# Patient Record
Sex: Male | Born: 1985 | Race: White | Hispanic: No | Marital: Married | Smoking: Never smoker
Health system: Southern US, Community
[De-identification: ages and names within clinical notes are randomized; demographics above are authoritative.]

---

## 2015-03-20 ENCOUNTER — Ambulatory Visit (INDEPENDENT_AMBULATORY_CARE_PROVIDER_SITE_OTHER): Payer: 59 | Admitting: Physician Assistant

## 2015-03-20 ENCOUNTER — Encounter: Payer: Self-pay | Admitting: Physician Assistant

## 2015-03-20 ENCOUNTER — Telehealth: Payer: Self-pay

## 2015-03-20 VITALS — BP 124/75 | HR 70 | Temp 97.6°F | Resp 16 | Ht 68.25 in | Wt 207.0 lb

## 2015-03-20 DIAGNOSIS — Z7189 Other specified counseling: Secondary | ICD-10-CM

## 2015-03-20 DIAGNOSIS — L700 Acne vulgaris: Secondary | ICD-10-CM | POA: Diagnosis not present

## 2015-03-20 DIAGNOSIS — J3089 Other allergic rhinitis: Secondary | ICD-10-CM

## 2015-03-20 DIAGNOSIS — Z8659 Personal history of other mental and behavioral disorders: Secondary | ICD-10-CM | POA: Diagnosis not present

## 2015-03-20 DIAGNOSIS — H6123 Impacted cerumen, bilateral: Secondary | ICD-10-CM

## 2015-03-20 DIAGNOSIS — Z7689 Persons encountering health services in other specified circumstances: Secondary | ICD-10-CM

## 2015-03-20 NOTE — Progress Notes (Signed)
03/20/2015 at 5:12 PM  Brad Ruiz / DOB: [birth year] / MRN: [id number]  The patient  does not have a problem list on file.  SUBJECTIVE  Brad Ruiz is a 29 y.o. well appearing male presenting for the chief complaint of stuffy ears, back and neck pain, and would like some advise about his history of ADD. He also complains of acne.   He is a new Engineer, civil (consulting) working at [employer] and has recently moved to the area from [place].  He has been having severe allergies since moving here and requires Zyrtec daily for this with little relief of eye itching, sneezing, nasal congestion and ear stuffiness.    He has had acne on and off as a teen and reports good success with Benzoyl Peroxide, but has stopped this and can not remember why.  Complains of closed comedones mostly on the cheeks a forehead.    Has a history of ADD diagnosed at age 81 and would like further work up.  He reports he is forgetful or absent minded, and struggled through nursing school.  He would like to avoid amphetamines if possible.      He  has no past medical history on file.    Medications reviewed and updated by myself where necessary, and exist elsewhere in the encounter.   Brad Ruiz has No Known Allergies. He  reports that he has never smoked. He does not have any smokeless tobacco history on file. He  has no sexual activity history on file. The patient  has no past surgical history on file.  His family history is not on file.  Review of Systems  Constitutional: Negative for fever and chills.  Respiratory: Negative for shortness of breath.   Cardiovascular: Negative for chest pain.  Gastrointestinal: Negative for nausea and abdominal pain.  Genitourinary: Negative.   Skin: Negative for rash.  Neurological: Negative for dizziness and headaches.    OBJECTIVE  His  height is 5' 8.25" (1.734 m) and weight is 207 lb (93.895 kg). His oral temperature is 97.6 F (36.4 C). His blood pressure is 124/75 and his pulse is 70.  His respiration is 16.  The patient's body mass index is 31.23 kg/(m^2).  Physical Exam  Vitals reviewed. Constitutional: He is oriented to person, place, and time. He appears well-developed. No distress.  HENT:  Ears:  Nose: Mucosal edema (bluish hue) present.  Mouth/Throat: Uvula is midline, oropharynx is clear and moist and mucous membranes are normal.  Eyes: EOM are normal. Pupils are equal, round, and reactive to light. No scleral icterus.  Neck: Normal range of motion.  Cardiovascular: Normal rate and regular rhythm.   Respiratory: Effort normal and breath sounds normal.  GI: Soft. He exhibits no distension.  Musculoskeletal: Normal range of motion.  Neurological: He is alert and oriented to person, place, and time. No cranial nerve deficit.  Skin: Skin is warm and dry. No rash noted. He is not diaphoretic.  Psychiatric: He has a normal mood and affect.    No results found for this or any previous visit (from the past 24 hour(s)).  ASSESSMENT & PLAN  Brad Ruiz was seen today for establish care and allergic rhinitis .  Diagnoses and all orders for this visit:  Encounter to establish care  History of attention deficit disorder: Patient best managed by psychiatry given that he may require amphetamines for control.  -     Ambulatory referral to Psychiatry  Acne vulgaris: Patient with good success with Benzoyl Peroxide.  Advised he go back to that.   Cerumen impaction, bilateral -     Ear wax removal  Environmental and seasonal allergies: Advised patient to try Flonase two sprays bilaterally qam and to stay with Zyrtec for 7 days while bridging nasal steroid.      The patient was advised to call or come back to clinic if he does not see an improvement in symptoms, or worsens with the above plan.   Deliah Boston, MHS, PA-C [facility] 03/20/2015 5:12 PM

## 2015-03-20 NOTE — Patient Instructions (Signed)
Try Benzol peroxide 10% face wash.  (Acne Free)

## 2015-03-20 NOTE — Telephone Encounter (Signed)
Patient's wife request for [place] to call her mobile phone when Jacorion's referral is scheduled [phone].

## 2015-03-23 ENCOUNTER — Encounter: Payer: Self-pay | Admitting: Podiatry

## 2015-03-28 NOTE — Telephone Encounter (Signed)
Waiting on referral.  Would like to hear something soon.  He will be out of meds shortly.   [phone]

## 2015-04-05 NOTE — Progress Notes (Signed)
This encounter was created in error - please disregard.

## 2015-04-06 ENCOUNTER — Ambulatory Visit (INDEPENDENT_AMBULATORY_CARE_PROVIDER_SITE_OTHER): Payer: 59 | Admitting: Emergency Medicine

## 2015-04-06 VITALS — BP 128/80 | HR 69 | Temp 98.4°F | Resp 16 | Ht 69.0 in | Wt 206.0 lb

## 2015-04-06 DIAGNOSIS — F909 Attention-deficit hyperactivity disorder, unspecified type: Secondary | ICD-10-CM | POA: Diagnosis not present

## 2015-04-06 DIAGNOSIS — F988 Other specified behavioral and emotional disorders with onset usually occurring in childhood and adolescence: Secondary | ICD-10-CM

## 2015-04-06 MED ORDER — LISDEXAMFETAMINE DIMESYLATE 50 MG PO CAPS: 50.0000 mg | ORAL_CAPSULE | Freq: Every day | ORAL | Status: DC

## 2015-04-06 NOTE — Patient Instructions (Signed)

## 2015-04-06 NOTE — Progress Notes (Signed)
Subjective:  Patient ID: Brad Ruiz, male    DOB: 03/01/1986  Age: 29 y.o. MRN: [id number]  CC: Advice Only   HPI [name] presents  for evaluation and possible refill of his Vyvanse. While living in [place] high school and college she used initially Adderall and Vyvanse for control of his ADD. He stopped taking the medicine of May when he graduated college. Now he has a job as a Conservation officer, nature in the emergency room and is finding that he has trouble keeping on task. And concentrating on his job. His wife describes him constantly forgetting where he he keeps his keys and credit cards and work badges.   History Hunner has no past medical history on file.   He has no past surgical history on file.   His  family history includes Heart disease in his father.  He   reports that he has never smoked. He does not have any smokeless tobacco history on file. His alcohol and drug histories are not on file.  No outpatient prescriptions prior to visit.   No facility-administered medications prior to visit.    Social History   Social History  . Marital Status: Married    Spouse Name: N/A  . Number of Children: N/A  . Years of Education: N/A   Social History Main Topics  . Smoking status: Never Smoker   . Smokeless tobacco: None  . Alcohol Use: None  . Drug Use: None  . Sexual Activity: Not Asked   Other Topics Concern  . None   Social History Narrative     Review of Systems  Constitutional: Negative for fever, chills and appetite change.  HENT: Negative for congestion, ear pain, postnasal drip, sinus pressure and sore throat.   Eyes: Negative for pain and redness.  Respiratory: Negative for cough, shortness of breath and wheezing.   Cardiovascular: Negative for leg swelling.  Gastrointestinal: Negative for nausea, vomiting, abdominal pain, diarrhea, constipation and blood in stool.  Endocrine: Negative for polyuria.  Genitourinary: Negative for dysuria,  urgency, frequency and flank pain.  Musculoskeletal: Negative for gait problem.  Skin: Negative for rash.  Neurological: Negative for weakness and headaches.  Psychiatric/Behavioral: Negative for confusion and decreased concentration. The patient is not nervous/anxious.     Objective:  BP 128/80 mmHg  Pulse 69  Temp(Src) 98.4 F (36.9 C) (Oral)  Resp 16  Ht  (1.753 m)  Wt 206 lb (93.441 kg)  BMI 30.41 kg/m2  SpO2 98%  Physical Exam  Constitutional: He is oriented to person, place, and time. He appears well-developed and well-nourished. No distress.  HENT:  Head: Normocephalic and atraumatic.  Right Ear: External ear normal.  Left Ear: External ear normal.  Nose: Nose normal.  Eyes: Conjunctivae and EOM are normal. Pupils are equal, round, and reactive to light. No scleral icterus.  Neck: Normal range of motion. Neck supple. No tracheal deviation present.  Cardiovascular: Normal rate, regular rhythm and normal heart sounds.   Pulmonary/Chest: Effort normal. No respiratory distress. He has no wheezes. He has no rales.  Abdominal: He exhibits no mass. There is no tenderness. There is no rebound and no guarding.  Musculoskeletal: He exhibits no edema.  Lymphadenopathy:    He has no cervical adenopathy.  Neurological: He is alert and oriented to person, place, and time. Coordination normal.  Skin: Skin is warm and dry. No rash noted.  Psychiatric: He has a normal mood and affect. His behavior is normal.  Assessment & Plan:   Raylon was seen today for advice only.  Diagnoses and all orders for this visit:  ADD (attention deficit disorder)  Other orders -     lisdexamfetamine (VYVANSE) 50 MG capsule; Take 1 capsule (50 mg total) by mouth daily. -     lisdexamfetamine (VYVANSE) 50 MG capsule; Take 1 capsule (50 mg total) by mouth daily. May fill in 30 days -     lisdexamfetamine (VYVANSE) 50 MG capsule; Take 1 capsule (50 mg total) by mouth daily. May fill in 60  days  I am having Mr. Caesar start on lisdexamfetamine, lisdexamfetamine, and lisdexamfetamine.  Meds ordered this encounter  Medications  . lisdexamfetamine (VYVANSE) 50 MG capsule    Sig: Take 1 capsule (50 mg total) by mouth daily.    Dispense:  30 capsule    Refill:  0  . lisdexamfetamine (VYVANSE) 50 MG capsule    Sig: Take 1 capsule (50 mg total) by mouth daily. May fill in 30 days    Dispense:  30 capsule    Refill:  0  . lisdexamfetamine (VYVANSE) 50 MG capsule    Sig: Take 1 capsule (50 mg total) by mouth daily. May fill in 60 days    Dispense:  30 capsule    Refill:  0    Appropriate red flag conditions were discussed with the patient as well as actions that should be taken.  Patient expressed his understanding.  Follow-up: Return in about 3 months (around 07/06/2015).  Carmelina Dane, MD

## 2015-04-27 ENCOUNTER — Ambulatory Visit (INDEPENDENT_AMBULATORY_CARE_PROVIDER_SITE_OTHER): Payer: 59

## 2015-04-27 ENCOUNTER — Ambulatory Visit (INDEPENDENT_AMBULATORY_CARE_PROVIDER_SITE_OTHER): Payer: 59 | Admitting: Urgent Care

## 2015-04-27 VITALS — BP 106/74 | HR 64 | Temp 98.2°F | Resp 16 | Ht 69.0 in | Wt 202.0 lb

## 2015-04-27 DIAGNOSIS — M62830 Muscle spasm of back: Secondary | ICD-10-CM

## 2015-04-27 DIAGNOSIS — M545 Low back pain: Secondary | ICD-10-CM | POA: Diagnosis not present

## 2015-04-27 DIAGNOSIS — S39012A Strain of muscle, fascia and tendon of lower back, initial encounter: Secondary | ICD-10-CM | POA: Diagnosis not present

## 2015-04-27 DIAGNOSIS — F909 Attention-deficit hyperactivity disorder, unspecified type: Secondary | ICD-10-CM

## 2015-04-27 DIAGNOSIS — F988 Other specified behavioral and emotional disorders with onset usually occurring in childhood and adolescence: Secondary | ICD-10-CM

## 2015-04-27 MED ORDER — LISDEXAMFETAMINE DIMESYLATE 50 MG PO CAPS: 50.0000 mg | ORAL_CAPSULE | Freq: Every day | ORAL | Status: DC

## 2015-04-27 MED ORDER — CYCLOBENZAPRINE HCL 10 MG PO TABS: 5.0000 mg | ORAL_TABLET | Freq: Three times a day (TID) | ORAL | Status: DC | PRN

## 2015-04-27 MED ORDER — MELOXICAM 15 MG PO TABS: 7.5000 mg | ORAL_TABLET | Freq: Every day | ORAL | Status: DC
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
## 2015-04-27 NOTE — Patient Instructions (Signed)

## 2015-05-03 ENCOUNTER — Telehealth: Payer: Self-pay

## 2015-05-03 DIAGNOSIS — M545 Low back pain: Secondary | ICD-10-CM

## 2015-05-03 NOTE — Telephone Encounter (Signed)
1. Lumbar strain, initial encounter 2. Low back pain without sciatica, unspecified back pain laterality 3. Back spasm - Advised conservative management. Start meloxicam and Flexeril. Recommended modification of activities including taking a break from weight training including dead lifts and squats. Will recommend physical therapy or referral to ortho if no improvement in 2 weeks. Patient agreed.  Did you want to change medication or place referral?

## 2015-05-03 NOTE — Telephone Encounter (Signed)
Pt wife Baxter Hire called in very upset that she called in earlier this morning and left a message for her husband to get a referral and it hasn't happened yet.  I explained to her that messages were already in and as soon as the referral was done by the doctor we would get it taken care of. She feels it's unacceptable to have to wait this long. Pt wife states that he is in terrible pain & agony.

## 2015-05-03 NOTE — Telephone Encounter (Signed)
I placed the referral. It may be worthwhile for the patient to come for a urinalysis as well since his pain may be due to kidney stones. Please offer this to patient. Thank you!

## 2015-05-03 NOTE — Telephone Encounter (Signed)
Left message for pt to call back  °

## 2015-05-03 NOTE — Telephone Encounter (Signed)
Patient's wife Baxter Hire called to request a referral for her husband to see an orthopaedic doctor for his back spasms.  He was seen on 04/27/15 and was prescribed medications for the pain, but she said they are not working.  According to his wife, he is in agony and he needs a referral now.  CB#: [phone]

## 2015-05-04 NOTE — Telephone Encounter (Signed)
I called [facility] to try to set up appt  Tomorrow 9:15 am Dr. Clista Bernhardt pt to let him know and he is unable to go to this appt. He states he has an appt on the 5th and they will just keep this appt.

## 2015-05-09 NOTE — Progress Notes (Signed)
  Medical screening examination/treatment/procedure(s) were performed by non-physician practitioner and as supervising physician I was immediately available for consultation/collaboration.     

## 2015-06-15 ENCOUNTER — Ambulatory Visit (INDEPENDENT_AMBULATORY_CARE_PROVIDER_SITE_OTHER): Payer: 59 | Admitting: Emergency Medicine

## 2015-06-15 VITALS — BP 112/60 | HR 70 | Temp 97.8°F | Resp 16 | Ht 69.0 in | Wt 200.4 lb

## 2015-06-15 DIAGNOSIS — F909 Attention-deficit hyperactivity disorder, unspecified type: Secondary | ICD-10-CM

## 2015-06-15 DIAGNOSIS — F988 Other specified behavioral and emotional disorders with onset usually occurring in childhood and adolescence: Secondary | ICD-10-CM

## 2015-06-15 DIAGNOSIS — M5431 Sciatica, right side: Secondary | ICD-10-CM

## 2015-06-15 LAB — BASIC METABOLIC PANEL
BUN: 28 mg/dL — AB (ref 7–25)
CALCIUM: 9.7 mg/dL (ref 8.6–10.3)
CO2: 27 mmol/L (ref 20–31)
Chloride: 102 mmol/L (ref 98–110)
Creat: 0.97 mg/dL (ref 0.60–1.35)
GLUCOSE: 78 mg/dL (ref 65–99)
POTASSIUM: 4 mmol/L (ref 3.5–5.3)
Sodium: 139 mmol/L (ref 135–146)

## 2015-06-15 MED ORDER — LISDEXAMFETAMINE DIMESYLATE 50 MG PO CAPS: 50.0000 mg | ORAL_CAPSULE | Freq: Every day | ORAL | Status: AC

## 2015-06-15 MED ORDER — LISDEXAMFETAMINE DIMESYLATE 50 MG PO CAPS: 50.0000 mg | ORAL_CAPSULE | Freq: Every day | ORAL | Status: DC

## 2015-06-15 MED ORDER — LISDEXAMFETAMINE DIMESYLATE 50 MG PO CAPS
50.0000 mg | ORAL_CAPSULE | Freq: Every day | ORAL | Status: DC
Start: 2015-06-15 — End: 2015-09-28

## 2015-06-15 MED ORDER — PREDNISONE 10 MG (48) PO TBPK: ORAL_TABLET | ORAL | Status: DC

## 2015-06-15 MED ORDER — TRAMADOL HCL 50 MG PO TABS: 50.0000 mg | ORAL_TABLET | Freq: Four times a day (QID) | ORAL | Status: DC | PRN

## 2015-06-15 NOTE — Progress Notes (Signed)
Subjective:  Patient ID: Brad Ruiz, male    DOB: [birth year]  Age: 29 y.o. MRN: [id number]  CC: Back Pain and Medication Refill   HPI Brad Ruiz presents  for follow-up for his back. Schedule for MRI and has persistent right sciatic neuritis. He says he is worse when he sits down and has to sit for periods time. He has no numbness tingling or weakness in his leg. His medication that he was treated with did not improve his discomfort. He is scheduled for MRI. He also needs refill on his Vyvanse. He's been taking the medication having him no adverse effect of the medication is eating and sleeping maintaining his weight.  History Brad Ruiz has no past medical history on file.   He has no past surgical history on file.   His  family history includes Heart disease in his father.  He   reports that he has never smoked. He does not have any smokeless tobacco history on file. His alcohol and drug histories are not on file.  Outpatient Prescriptions Prior to Visit  Medication Sig Dispense Refill  . cyclobenzaprine (FLEXERIL) 10 MG tablet Take 0.5-1 tablets (5-10 mg total) by mouth 3 (three) times daily as needed for muscle spasms. 60 tablet 6  . lisdexamfetamine (VYVANSE) 50 MG capsule Take 1 capsule (50 mg total) by mouth daily. 30 capsule 0  . lisdexamfetamine (VYVANSE) 50 MG capsule Take 1 capsule (50 mg total) by mouth daily. May fill in 30 days 30 capsule 0  . lisdexamfetamine (VYVANSE) 50 MG capsule Take 1 capsule (50 mg total) by mouth daily. May fill 06/06/2015. 30 capsule 0  . meloxicam (MOBIC) 15 MG tablet Take 0.5-1 tablets (7.5-15 mg total) by mouth daily. 30 tablet 1   No facility-administered medications prior to visit.    Social History   Social History  . Marital Status: Married    Spouse Name: N/A  . Number of Children: N/A  . Years of Education: N/A   Social History Main Topics  . Smoking status: Never Smoker   . Smokeless tobacco: None  . Alcohol Use:  None  . Drug Use: None  . Sexual Activity: Not Asked   Other Topics Concern  . None   Social History Narrative     Review of Systems  Constitutional: Negative for fever, chills and appetite change.  HENT: Negative for congestion, ear pain, postnasal drip, sinus pressure and sore throat.   Eyes: Negative for pain and redness.  Respiratory: Negative for cough, shortness of breath and wheezing.   Cardiovascular: Negative for leg swelling.  Gastrointestinal: Negative for nausea, vomiting, abdominal pain, diarrhea, constipation and blood in stool.  Endocrine: Negative for polyuria.  Genitourinary: Negative for dysuria, urgency, frequency and flank pain.  Musculoskeletal: Positive for back pain. Negative for gait problem.  Skin: Negative for rash.  Neurological: Negative for weakness and headaches.  Psychiatric/Behavioral: Negative for confusion and decreased concentration. The patient is not nervous/anxious.     Objective:  BP 112/60 mmHg  Pulse 70  Temp(Src) 97.8 F (36.6 C) (Oral)  Resp 16  Ht  (1.753 m)  Wt 200 lb 6.4 oz (90.901 kg)  BMI 29.58 kg/m2  SpO2 99%  Physical Exam  Constitutional: He is oriented to person, place, and time. He appears well-developed and well-nourished.  HENT:  Head: Normocephalic and atraumatic.  Eyes: Conjunctivae are normal. Pupils are equal, round, and reactive to light.  Pulmonary/Chest: Effort normal.  Musculoskeletal: He exhibits no edema.  Neurological:  He is alert and oriented to person, place, and time.  Skin: Skin is dry.  Psychiatric: He has a normal mood and affect. His behavior is normal. Thought content normal.      Assessment & Plan:   Brad Ruiz was seen today for back pain and medication refill.  Diagnoses and all orders for this visit:  ADD (attention deficit disorder)  Sciatic neuritis, right -     Basic metabolic panel  Other orders -     traMADol (ULTRAM) 50 MG tablet; Take 1 tablet (50 mg total) by mouth  every 6 (six) hours as needed. -     predniSONE (STERAPRED UNI-PAK 48 TAB) 10 MG (48) TBPK tablet; Take as directed on package -     lisdexamfetamine (VYVANSE) 50 MG capsule; Take 1 capsule (50 mg total) by mouth daily. -     lisdexamfetamine (VYVANSE) 50 MG capsule; Take 1 capsule (50 mg total) by mouth daily. May fill in 30 days -     lisdexamfetamine (VYVANSE) 50 MG capsule; Take 1 capsule (50 mg total) by mouth daily. May fill in 60 days   I have discontinued Brad Ruiz's meloxicam. I have also changed his lisdexamfetamine. Additionally, I am having him start on traMADol and predniSONE. Lastly, I am having him maintain his cyclobenzaprine, lisdexamfetamine, and lisdexamfetamine.  Meds ordered this encounter  Medications  . traMADol (ULTRAM) 50 MG tablet    Sig: Take 1 tablet (50 mg total) by mouth every 6 (six) hours as needed.    Dispense:  50 tablet    Refill:  0  . predniSONE (STERAPRED UNI-PAK 48 TAB) 10 MG (48) TBPK tablet    Sig: Take as directed on package    Dispense:  48 tablet    Refill:  0  . lisdexamfetamine (VYVANSE) 50 MG capsule    Sig: Take 1 capsule (50 mg total) by mouth daily.    Dispense:  30 capsule    Refill:  0  . lisdexamfetamine (VYVANSE) 50 MG capsule    Sig: Take 1 capsule (50 mg total) by mouth daily. May fill in 30 days    Dispense:  30 capsule    Refill:  0  . lisdexamfetamine (VYVANSE) 50 MG capsule    Sig: Take 1 capsule (50 mg total) by mouth daily. May fill in 60 days    Dispense:  30 capsule    Refill:  0    Appropriate red flag conditions were discussed with the patient as well as actions that should be taken.  Patient expressed his understanding.  Follow-up: Return if symptoms worsen or fail to improve.  Carmelina DaneAnderson, Deronte Solis S, MD

## 2015-06-15 NOTE — Patient Instructions (Signed)
Radicular Pain °Radicular pain in either the arm or leg is usually from a bulging or herniated disk in the spine. A piece of the herniated disk may press against the nerves as the nerves exit the spine. This causes pain which is felt at the tips of the nerves down the arm or leg. Other causes of radicular pain may include: °· Fractures. °· Heart disease. °· Cancer. °· An abnormal and usually degenerative state of the nervous system or nerves (neuropathy). °Diagnosis may require CT or MRI scanning to determine the primary cause.  °Nerves that start at the neck (nerve roots) may cause radicular pain in the outer shoulder and arm. It can spread down to the thumb and fingers. The symptoms vary depending on which nerve root has been affected. In most cases radicular pain improves with conservative treatment. Neck problems may require physical therapy, a neck collar, or cervical traction. Treatment may take many weeks, and surgery may be considered if the symptoms do not improve.  °Conservative treatment is also recommended for sciatica. Sciatica causes pain to radiate from the lower back or buttock area down the leg into the foot. Often there is a history of back problems. Most patients with sciatica are better after 2 to 4 weeks of rest and other supportive care. Short term bed rest can reduce the disk pressure considerably. Sitting, however, is not a good position since this increases the pressure on the disk. You should avoid bending, lifting, and all other activities which make the problem worse. Traction can be used in severe cases. Surgery is usually reserved for patients who do not improve within the first months of treatment. °Only take over-the-counter or prescription medicines for pain, discomfort, or fever as directed by your caregiver. Narcotics and muscle relaxants may help by relieving more severe pain and spasm and by providing mild sedation. Cold or massage can give significant relief. Spinal manipulation  is not recommended. It can increase the degree of disc protrusion. Epidural steroid injections are often effective treatment for radicular pain. These injections deliver medicine to the spinal nerve in the space between the protective covering of the spinal cord and back bones (vertebrae). Your caregiver can give you more information about steroid injections. These injections are most effective when given within two weeks of the onset of pain.  °You should see your caregiver for follow up care as recommended. A program for neck and back injury rehabilitation with stretching and strengthening exercises is an important part of management.  °SEEK IMMEDIATE MEDICAL CARE IF: °· You develop increased pain, weakness, or numbness in your arm or leg. °· You develop difficulty with bladder or bowel control. °· You develop abdominal pain. °  °This information is not intended to replace advice given to you by your health care provider. Make sure you discuss any questions you have with your health care provider. °  °Document Released: 08/29/2004 Document Revised: 08/12/2014 Document Reviewed: 02/15/2015 °Elsevier Interactive Patient Education ©2016 [employer] ° °

## 2015-09-28 ENCOUNTER — Ambulatory Visit (INDEPENDENT_AMBULATORY_CARE_PROVIDER_SITE_OTHER): Payer: 59 | Admitting: Family Medicine

## 2015-09-28 VITALS — BP 118/76 | HR 67 | Temp 97.8°F | Resp 16 | Ht 68.0 in | Wt 200.0 lb

## 2015-09-28 DIAGNOSIS — F988 Other specified behavioral and emotional disorders with onset usually occurring in childhood and adolescence: Secondary | ICD-10-CM

## 2015-09-28 DIAGNOSIS — F909 Attention-deficit hyperactivity disorder, unspecified type: Secondary | ICD-10-CM

## 2015-09-28 DIAGNOSIS — M545 Low back pain, unspecified: Secondary | ICD-10-CM

## 2015-09-28 MED ORDER — MELOXICAM 15 MG PO TABS: 15.0000 mg | ORAL_TABLET | Freq: Every day | ORAL | Status: AC

## 2015-09-28 MED ORDER — LISDEXAMFETAMINE DIMESYLATE 50 MG PO CAPS: 50.0000 mg | ORAL_CAPSULE | Freq: Every day | ORAL | Status: DC

## 2015-09-28 NOTE — Patient Instructions (Addendum)
I have put in an order for a referral for ADD testing, you should receive a call from our office by Tuesday of next week to set up an appointment. If you have not heard from [place], please call the office and ask to speak to the referrals department.

## 2015-09-28 NOTE — Progress Notes (Signed)
Subjective:    Patient ID: Brad Ruiz, male    DOB: 1986-02-20, 30 y.o.   MRN: [id number]  HPI This is a pleasant 30 yo male who presents today for refill of Vyvanse for ADD. He is accompanied by his wife who is also being seen. 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 He works 12 hour night shifts. He enjoys the ED. He reports long standing Add and requests refill of his Vyvanse. He reports that it works well for him and he denies chest pain, sob, palpitations. Sleeps and eats well.   He was seen 8/16 and was referred to psychiatry for evaluation of ADD. The patient was nearly out of his medication and came in and was seen 9/16 by Dr. Dareen Piano who provided prescriptions for Vyvanse. He returned 06/15/15 and again saw Dr. Dareen Piano who refilled his Vyvanse. He has been on medication since middle school. His previous provider has retired and the patient does not have any records.   He has had intermittent low back pain since 6/16, started with an acute injury and was treated with Meloxicam with some improvement. Takes ibuprofen 800 mg several times a day, several times a week, would prefer to take Meloxicam. No weakness, no numbness/tingling. Has started yoga with some improvement, but feels like his improvement has stopped.   History reviewed. No pertinent past medical history. History reviewed. No pertinent past surgical history. Family History  Problem Relation Age of Onset  . Heart disease Father    Social History  Substance Use Topics  . Smoking status: Never Smoker   . Smokeless tobacco: Never Used  . Alcohol Use: No    Review of Systems No chest pain, no SOB, no palpitations    Objective:   Physical Exam Physical Exam  Constitutional: Oriented to person, place, and time. He appears well-developed and well-nourished.  HENT:  Head: Normocephalic and atraumatic.  Eyes: Conjunctivae are normal.  Neck: Normal range of motion. Neck supple.  Cardiovascular: Normal rate, regular  rhythm and normal heart sounds.   Pulmonary/Chest: Effort normal and breath sounds normal.  Musculoskeletal: Normal range of motion. Gait normal.  Neurological: Alert and oriented to person, place, and time.  Skin: Skin is warm and dry.  Psychiatric: Normal mood and affect. Behavior is normal. Judgment and thought content normal.  Vitals reviewed. BP 118/76 mmHg  Pulse 67  Temp(Src) 97.8 F (36.6 C) (Oral)  Resp 16  Ht  (1.727 m)  Wt 200 lb (90.719 kg)  BMI 30.42 kg/m2  SpO2 98% Wt Readings from Last 3 Encounters:  09/28/15 200 lb (90.719 kg)  06/15/15 200 lb 6.4 oz (90.901 kg)  04/27/15 202 lb (91.627 kg)      Assessment & Plan:  1. ADD (attention deficit disorder) - Discussed need for testing for prescribing. Will provide 2 months of Vyvanse while awaiting testing - Ambulatory referral to Psychology - lisdexamfetamine (VYVANSE) 50 MG capsule; Take 1 capsule (50 mg total) by mouth daily.  Dispense: 30 capsule; Refill: 0 - lisdexamfetamine (VYVANSE) 50 MG capsule; Take 1 capsule (50 mg total) by mouth daily. May fill in 30 days  Dispense: 30 capsule; Refill: 0  3. Midline back pain without sciatica - Meloxicam 15 mg; 1 po q day. Dispense 30, refill 0- discussed importance of not taking additional NSAIDs and using medication sparingly - Amb ref to PT  Olean Ree, FNP-BC  [facility], [facility]  09/28/2015 11:55 AM

## 2015-11-01 ENCOUNTER — Telehealth: Payer: Self-pay

## 2015-11-01 NOTE — Telephone Encounter (Signed)
Pt is needing to get a refill on his vyvanse best number [phone]

## 2015-11-02 NOTE — Telephone Encounter (Signed)
Spoke with patient's wife. [facility]. I will refill his Vyvanse for 2 months to get him through to his appointment. He will let me know who he wants to see for PT.

## 2015-11-06 ENCOUNTER — Other Ambulatory Visit: Payer: Self-pay | Admitting: Family Medicine

## 2015-11-06 DIAGNOSIS — F988 Other specified behavioral and emotional disorders with onset usually occurring in childhood and adolescence: Secondary | ICD-10-CM

## 2015-11-06 MED ORDER — LISDEXAMFETAMINE DIMESYLATE 50 MG PO CAPS: 50.0000 mg | ORAL_CAPSULE | Freq: Every day | ORAL | Status: AC

## 2015-11-06 NOTE — Telephone Encounter (Signed)
Please call patient and let him know that his prescription is ready for pick up at 102.

## 2015-12-28 DIAGNOSIS — M5431 Sciatica, right side: Secondary | ICD-10-CM | POA: Diagnosis not present

## 2016-01-02 DIAGNOSIS — M5441 Lumbago with sciatica, right side: Secondary | ICD-10-CM | POA: Diagnosis not present

## 2016-01-02 DIAGNOSIS — M545 Low back pain: Secondary | ICD-10-CM | POA: Diagnosis not present

## 2016-01-03 ENCOUNTER — Other Ambulatory Visit (HOSPITAL_COMMUNITY): Payer: Self-pay | Admitting: Orthopedic Surgery

## 2016-01-03 DIAGNOSIS — M5126 Other intervertebral disc displacement, lumbar region: Secondary | ICD-10-CM

## 2016-01-03 DIAGNOSIS — M5441 Lumbago with sciatica, right side: Secondary | ICD-10-CM

## 2016-01-09 ENCOUNTER — Ambulatory Visit (HOSPITAL_COMMUNITY)
Admission: RE | Admit: 2016-01-09 | Discharge: 2016-01-09 | Disposition: A | Discharge status: Home / Self Care | Payer: 59 | Source: Ambulatory Visit | Attending: Orthopedic Surgery | Admitting: Orthopedic Surgery

## 2016-01-09 DIAGNOSIS — M5441 Lumbago with sciatica, right side: Secondary | ICD-10-CM | POA: Diagnosis not present

## 2016-01-09 DIAGNOSIS — Z79899 Other long term (current) drug therapy: Secondary | ICD-10-CM | POA: Diagnosis not present

## 2016-01-09 DIAGNOSIS — F4010 Social phobia, unspecified: Secondary | ICD-10-CM | POA: Diagnosis not present

## 2016-01-09 DIAGNOSIS — F429 Obsessive-compulsive disorder, unspecified: Secondary | ICD-10-CM | POA: Diagnosis not present

## 2016-01-09 DIAGNOSIS — F419 Anxiety disorder, unspecified: Secondary | ICD-10-CM | POA: Diagnosis not present

## 2016-01-09 DIAGNOSIS — F902 Attention-deficit hyperactivity disorder, combined type: Secondary | ICD-10-CM | POA: Diagnosis not present

## 2016-01-09 DIAGNOSIS — H93299 Other abnormal auditory perceptions, unspecified ear: Secondary | ICD-10-CM | POA: Diagnosis not present

## 2016-01-09 DIAGNOSIS — F1920 Other psychoactive substance dependence, uncomplicated: Secondary | ICD-10-CM | POA: Diagnosis not present

## 2016-01-09 DIAGNOSIS — M5127 Other intervertebral disc displacement, lumbosacral region: Secondary | ICD-10-CM | POA: Diagnosis not present

## 2016-01-09 DIAGNOSIS — F338 Other recurrent depressive disorders: Secondary | ICD-10-CM | POA: Diagnosis not present

## 2016-01-09 DIAGNOSIS — R41840 Attention and concentration deficit: Secondary | ICD-10-CM | POA: Diagnosis not present

## 2016-01-09 DIAGNOSIS — M5126 Other intervertebral disc displacement, lumbar region: Secondary | ICD-10-CM | POA: Diagnosis not present

## 2016-01-30 DIAGNOSIS — M5126 Other intervertebral disc displacement, lumbar region: Secondary | ICD-10-CM | POA: Diagnosis not present

## 2016-02-13 ENCOUNTER — Telehealth: Payer: Self-pay

## 2016-02-13 NOTE — Telephone Encounter (Signed)
THIS MESSAGE IS FROM PATIENT'S WIFE (KRISTIN Laplante). SHE STATES DEBBIE GESSNER REFERRED HIM TO [facility] (DR. Carmela HurtSTEPHENSON). SHE SAID SHE IS VERY DISPLEASED WITH THE DOCTOR AND THE WHOLE OFFICE AND WOULD LIKE TO GET AN IMMEDIATE NEW REFERRAL TO ANOTHER PHYSICIAN. HIS NEXT APPOINTMENT IS July 18 TH, AND SHE WOULD LIKE US TO GET IT BEFORE HE HAS TO GO BACK THERE. HE IS ON A MEDICATION THAT HE CANNOT STOP TAKING. SHE WOULD LIKE A CALL BACK AS SOON AS POSSIBLE. BEST PHONE [phone] (WIFE IS KRISTIN)  MBC

## 2016-02-14 NOTE — Telephone Encounter (Signed)
Any other suggestions to send pt?

## 2016-02-14 NOTE — Telephone Encounter (Signed)
Patients wants call back unhappy with the medication from specialist  [phone]

## 2016-02-15 DIAGNOSIS — F900 Attention-deficit hyperactivity disorder, predominantly inattentive type: Secondary | ICD-10-CM | POA: Diagnosis not present

## 2016-02-15 NOTE — Telephone Encounter (Signed)
I recommend patient come in for evaluation. NP Eunice BlaseDebbie is no longer working here and has not been seen here for f/u since 09/2015. If his concerns are about Vyvanse are the issue, he can re-establish care with a different provider to address this. [place] Database shows that he should have enough medication for 1 month.

## 2016-02-19 NOTE — Telephone Encounter (Signed)
Patient was notified and will come in for a appointment when he can.

## 2016-04-22 DIAGNOSIS — M549 Dorsalgia, unspecified: Secondary | ICD-10-CM | POA: Diagnosis not present

## 2016-04-22 DIAGNOSIS — F900 Attention-deficit hyperactivity disorder, predominantly inattentive type: Secondary | ICD-10-CM | POA: Diagnosis not present

## 2016-04-22 DIAGNOSIS — Z23 Encounter for immunization: Secondary | ICD-10-CM | POA: Diagnosis not present

## 2016-04-22 DIAGNOSIS — Z0000 Encounter for general adult medical examination without abnormal findings: Secondary | ICD-10-CM | POA: Diagnosis not present

## 2016-07-10 DIAGNOSIS — Z6828 Body mass index (BMI) 28.0-28.9, adult: Secondary | ICD-10-CM | POA: Diagnosis not present

## 2016-07-10 DIAGNOSIS — F900 Attention-deficit hyperactivity disorder, predominantly inattentive type: Secondary | ICD-10-CM | POA: Diagnosis not present

## 2016-07-10 DIAGNOSIS — L509 Urticaria, unspecified: Secondary | ICD-10-CM | POA: Diagnosis not present

## 2016-07-10 DIAGNOSIS — R7989 Other specified abnormal findings of blood chemistry: Secondary | ICD-10-CM | POA: Diagnosis not present

## 2016-10-02 DIAGNOSIS — Z6829 Body mass index (BMI) 29.0-29.9, adult: Secondary | ICD-10-CM | POA: Diagnosis not present

## 2016-10-02 DIAGNOSIS — F900 Attention-deficit hyperactivity disorder, predominantly inattentive type: Secondary | ICD-10-CM | POA: Diagnosis not present

## 2016-10-02 DIAGNOSIS — R030 Elevated blood-pressure reading, without diagnosis of hypertension: Secondary | ICD-10-CM | POA: Diagnosis not present

## 2016-10-02 DIAGNOSIS — L509 Urticaria, unspecified: Secondary | ICD-10-CM | POA: Diagnosis not present

## 2016-11-27 DIAGNOSIS — F900 Attention-deficit hyperactivity disorder, predominantly inattentive type: Secondary | ICD-10-CM | POA: Diagnosis not present

## 2017-02-12 DIAGNOSIS — M549 Dorsalgia, unspecified: Secondary | ICD-10-CM | POA: Diagnosis not present

## 2017-02-12 DIAGNOSIS — Z6828 Body mass index (BMI) 28.0-28.9, adult: Secondary | ICD-10-CM | POA: Diagnosis not present

## 2017-02-12 DIAGNOSIS — F900 Attention-deficit hyperactivity disorder, predominantly inattentive type: Secondary | ICD-10-CM | POA: Diagnosis not present

## 2017-07-29 IMAGING — MR MR LUMBAR SPINE W/O CM
4 of 5 series · 19 of 48 positions shown · non-contrast
Comparison: Prior radiograph from 04/27/2015.

CLINICAL DATA: Initial evaluation for 1 year history of low back
pain with right leg pain and numbness.

EXAM:
MRI LUMBAR SPINE WITHOUT CONTRAST
TECHNIQUE: Multiplanar, multisequence MR imaging of the lumbar spine was
performed. No intravenous contrast was administered.

[Series 3: T1 · sagittal · 4.0mm · 0.49mm/px · 3 of 15 slices shown (1 of 2)]
[im 3/15]
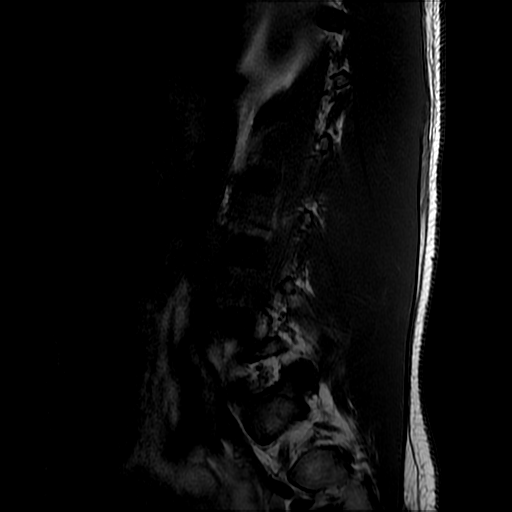
[im 9/15]
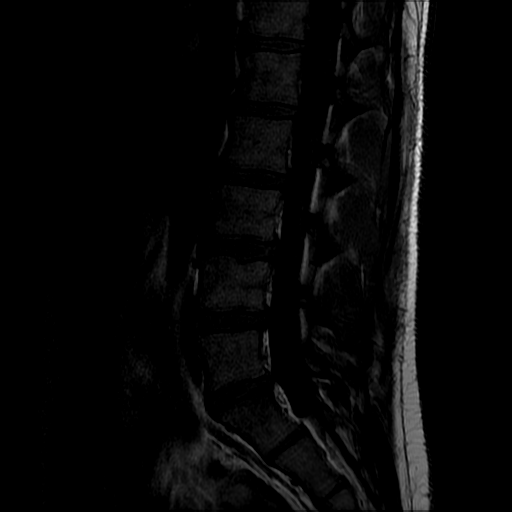
[im 15/15]
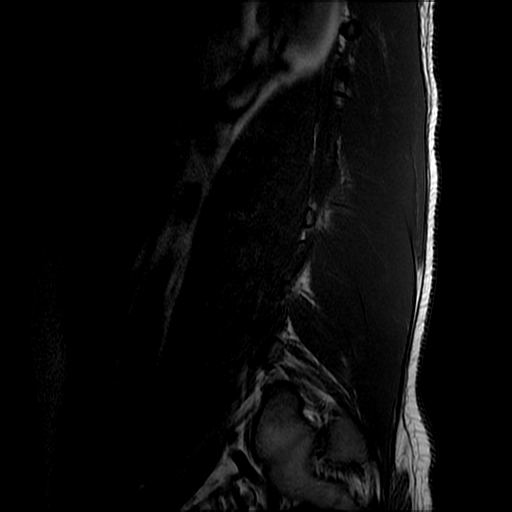

[Series 4: T2 · sagittal · 4.0mm · 0.49mm/px · 7 of 15 slices shown (1 of 2)]
[im 1/15]
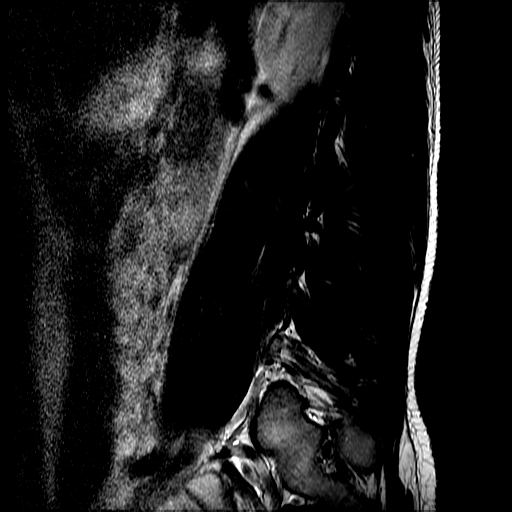
[im 3/15]
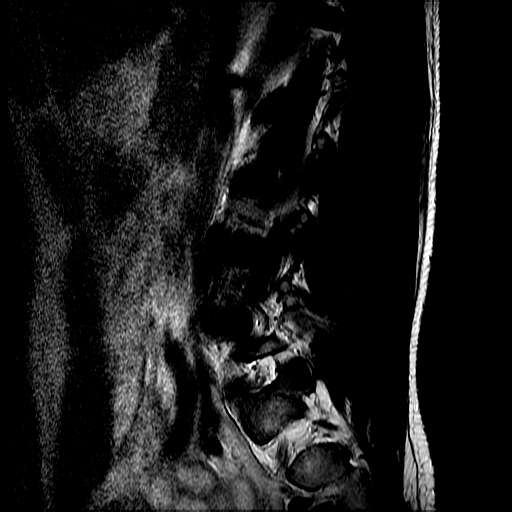
[im 5/15]
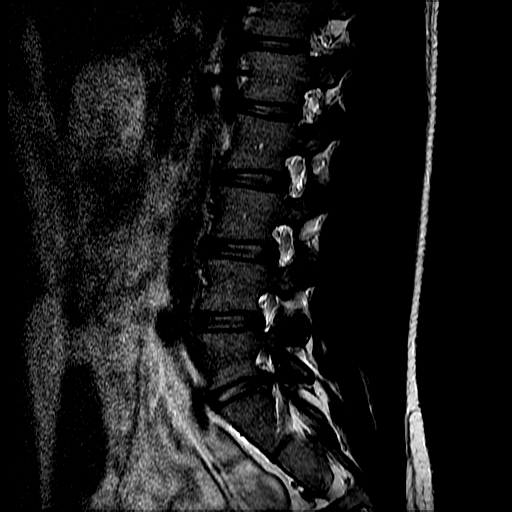
[im 8/15]
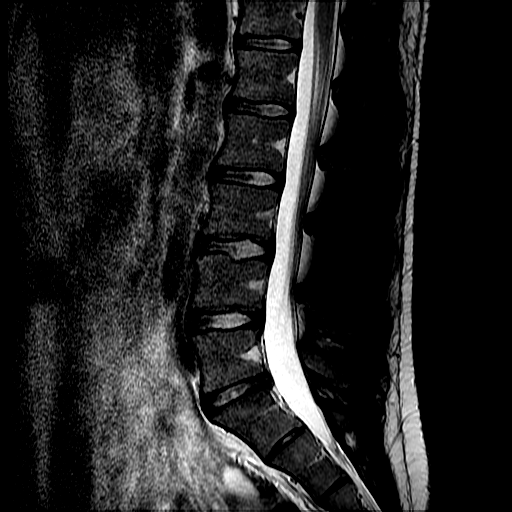
[im 10/15]
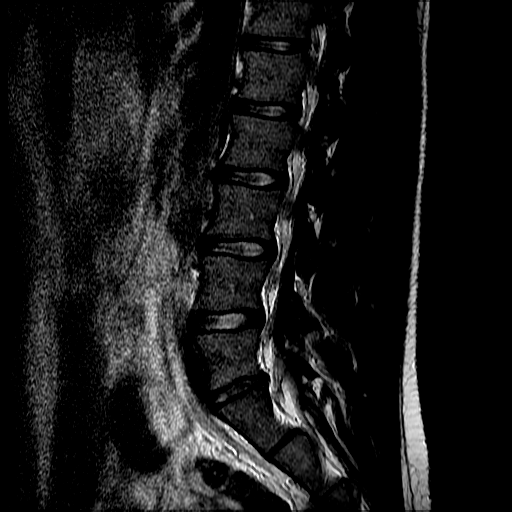
[im 12/15]
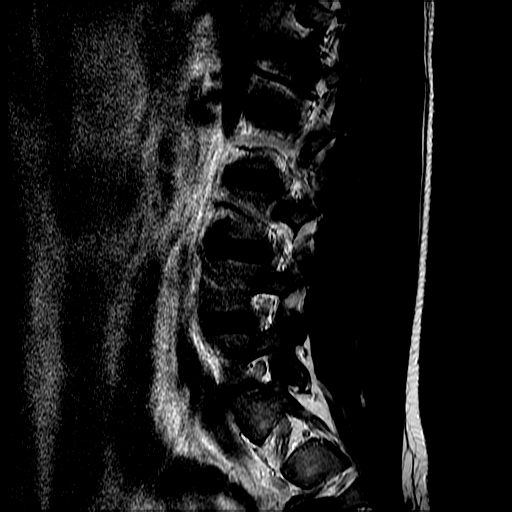
[im 15/15]
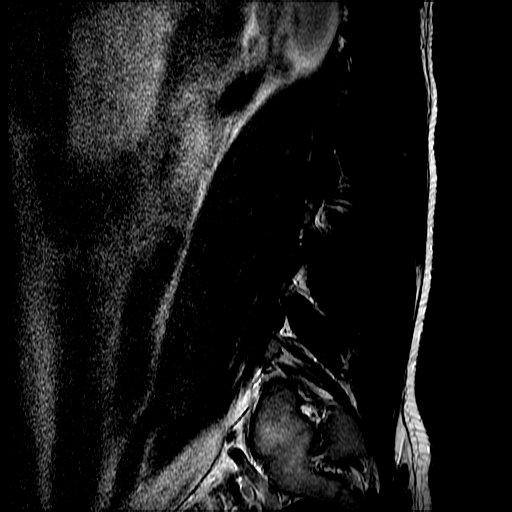

[Series 6: T2 · axial · 4.0mm · 0.39mm/px · z∈[-89,+52]mm · 6 of 32 slices shown (2 of 2)]
[im 1/32]
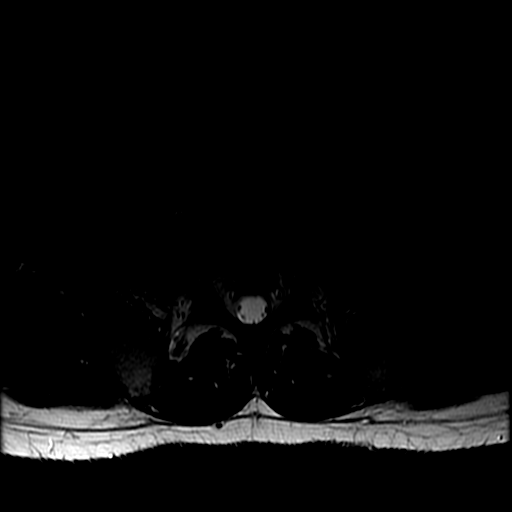
[im 5/32]
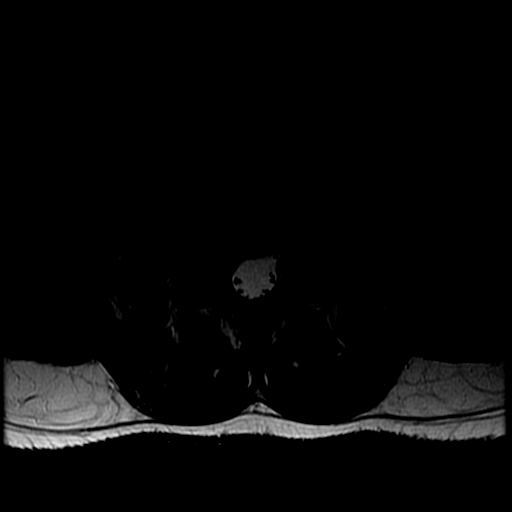
[im 10/32]
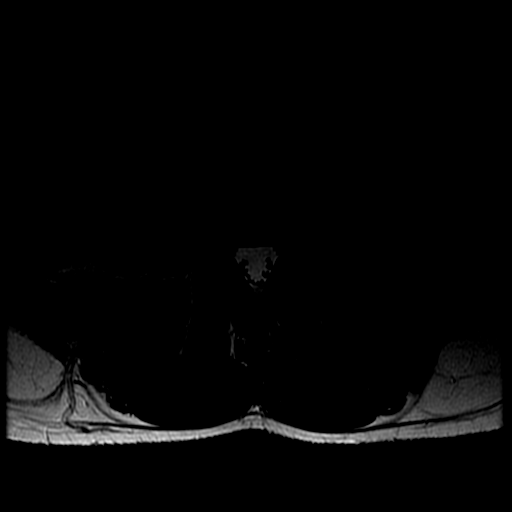
[im 15/32]
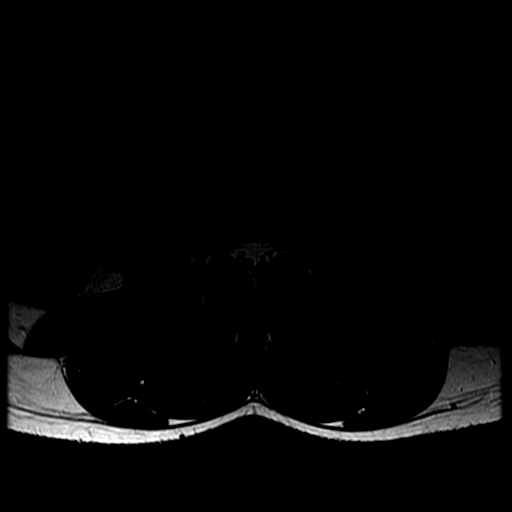
[im 17/32]
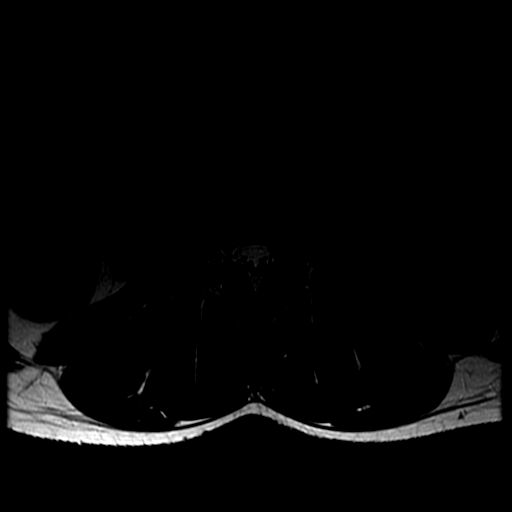
[im 27/32]
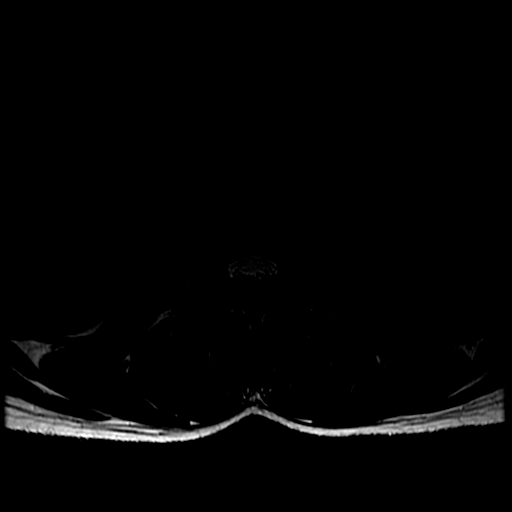

[Series 7: T1 · axial · 4.0mm · 0.39mm/px · z∈[-69,+52]mm · 3 of 32 slices shown (2 of 2)]
[im 5/32]
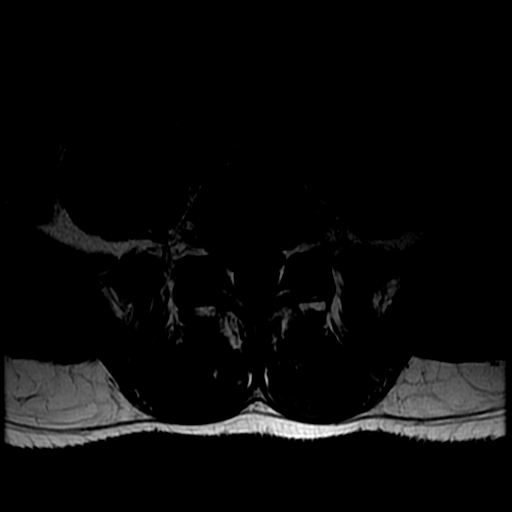
[im 17/32]
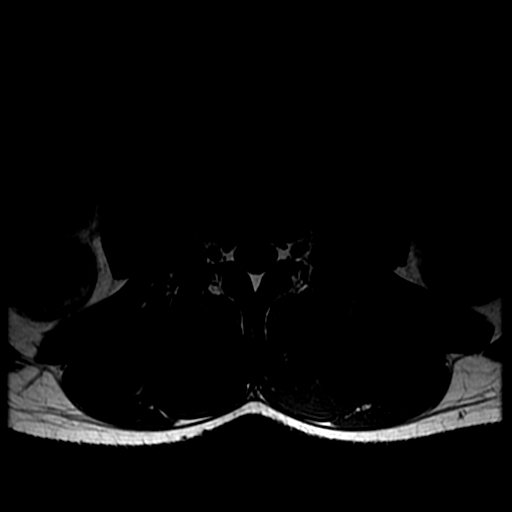
[im 27/32]
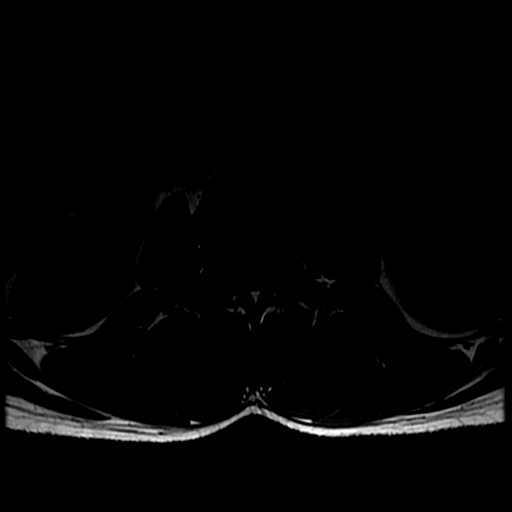

[19 of 48 positions shown; findings below may reference images not displayed]

FINDINGS: Segmentation: Normal segmentation without transitional lumbosacral
anatomy. Lowest well-formed disc is presumed to be the L5-S1 level.

Alignment: Vertebral bodies normally aligned with preservation of
the normal lumbar lordosis. No listhesis or subluxation.

Vertebrae: Vertebral body heights well maintained. No acute or
chronic fracture. Signal intensity within the vertebral body bone
marrow is normal. No focal osseous lesions. No marrow edema.

Conus medullaris: Extends to the L1 level and appears normal. Nerve
roots of the cauda equina are normal.

Paraspinal and other soft tissues: Paraspinous soft tissues within
normal limits. No retroperitoneal adenopathy. Visualized visceral
structures unremarkable.

Disc levels:

No significant degenerative changes seen through the L4-5 level.

L5-S1: Shallow right subarticular disc protrusion Ylene Mori is upon
the right lateral recess (series 4, image 6). Associated annular
fissure. Protruding disc contacts the transiting S1 nerve root, and
could potentially result in right lower extremity radicular
symptoms. No frank impingement of the right L5 nerve root. Canal
remains widely patent. No significant foraminal stenosis.
IMPRESSION: 1. Right subarticular disc protrusion at L5-S1, contacting the right
S1 nerve root in the right lateral recess. This could potentially
result in right lower extremity radicular symptoms. No associated
stenosis.
2. Otherwise normal MRI of the lumbar spine.
